# Patient Record
Sex: Male | Born: 2000 | Race: White | Hispanic: No | Marital: Single
Health system: Southern US, Community
[De-identification: ages and names within clinical notes are randomized; demographics above are authoritative.]

---

## 2021-04-15 ENCOUNTER — Other Ambulatory Visit: Payer: Self-pay

## 2021-04-15 ENCOUNTER — Emergency Department: Payer: 59

## 2021-04-15 ENCOUNTER — Encounter: Payer: Self-pay | Admitting: Emergency Medicine

## 2021-04-15 ENCOUNTER — Emergency Department
Admission: EM | Admit: 2021-04-15 | Discharge: 2021-04-15 | Disposition: A | Discharge status: Home / Self Care | Payer: 59 | Attending: Emergency Medicine | Admitting: Emergency Medicine

## 2021-04-15 DIAGNOSIS — R410 Disorientation, unspecified: Secondary | ICD-10-CM | POA: Diagnosis not present

## 2021-04-15 DIAGNOSIS — R109 Unspecified abdominal pain: Secondary | ICD-10-CM | POA: Diagnosis not present

## 2021-04-15 DIAGNOSIS — Y92410 Unspecified street and highway as the place of occurrence of the external cause: Secondary | ICD-10-CM | POA: Diagnosis not present

## 2021-04-15 DIAGNOSIS — S060X1A Concussion with loss of consciousness of 30 minutes or less, initial encounter: Secondary | ICD-10-CM | POA: Insufficient documentation

## 2021-04-15 DIAGNOSIS — T1490XA Injury, unspecified, initial encounter: Secondary | ICD-10-CM

## 2021-04-15 DIAGNOSIS — S060X9A Concussion with loss of consciousness of unspecified duration, initial encounter: Secondary | ICD-10-CM | POA: Diagnosis present

## 2021-04-15 LAB — COMPREHENSIVE METABOLIC PANEL
ALT: 29 U/L (ref 0–44)
AST: 28 U/L (ref 15–41)
Albumin: 4.8 g/dL (ref 3.5–5.0)
Alkaline Phosphatase: 66 U/L (ref 38–126)
Anion gap: 10 (ref 5–15)
BUN: 17 mg/dL (ref 6–20)
CO2: 24 mmol/L (ref 22–32)
Calcium: 9.5 mg/dL (ref 8.9–10.3)
Chloride: 103 mmol/L (ref 98–111)
Creatinine, Ser: 1.01 mg/dL (ref 0.61–1.24)
GFR, Estimated: >60 mL/min (ref >60)
Glucose, Bld: 159 mg/dL — ABNORMAL HIGH (ref 70–99)
Potassium: 3.5 mmol/L (ref 3.5–5.1)
Sodium: 137 mmol/L (ref 135–145)
Total Bilirubin: 1.3 mg/dL — ABNORMAL HIGH (ref 0.3–1.2)
Total Protein: 7.5 g/dL (ref 6.5–8.1)

## 2021-04-15 LAB — CBC WITH DIFFERENTIAL/PLATELET
Abs Immature Granulocytes: 0.16 10*3/uL — ABNORMAL HIGH (ref 0.00–0.07)
Basophils Absolute: 0.1 10*3/uL (ref 0.0–0.1)
Basophils Relative: 0 %
Eosinophils Absolute: 0 10*3/uL (ref 0.0–0.5)
Eosinophils Relative: 0 %
HCT: 42.6 % (ref 39.0–52.0)
Hemoglobin: 15.9 g/dL (ref 13.0–17.0)
Immature Granulocytes: 1 %
Lymphocytes Relative: 9 %
Lymphs Abs: 1.8 10*3/uL (ref 0.7–4.0)
MCH: 30 pg (ref 26.0–34.0)
MCHC: 37.3 g/dL — ABNORMAL HIGH (ref 30.0–36.0)
MCV: 80.4 fL (ref 80.0–100.0)
Monocytes Absolute: 1.3 10*3/uL — ABNORMAL HIGH (ref 0.1–1.0)
Monocytes Relative: 7 %
Neutro Abs: 16.9 10*3/uL — ABNORMAL HIGH (ref 1.7–7.7)
Neutrophils Relative %: 83 %
Platelets: 331 10*3/uL (ref 150–400)
RBC: 5.3 MIL/uL (ref 4.22–5.81)
RDW: 11.7 % (ref 11.5–15.5)
WBC: 20.2 10*3/uL — ABNORMAL HIGH (ref 4.0–10.5)
nRBC: 0 % (ref 0.0–0.2)
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
## 2021-04-15 MED ORDER — MORPHINE SULFATE (PF) 4 MG/ML IV SOLN
4.0000 mg | Freq: Once | INTRAVENOUS | Status: AC
  Administered 2021-04-15: 4 mg via INTRAVENOUS
  Filled 2021-04-15: qty 1

## 2021-04-15 MED ORDER — ONDANSETRON 4 MG PO TBDP: 4.0000 mg | ORAL_TABLET | Freq: Three times a day (TID) | ORAL | 0 refills | Status: AC | PRN

## 2021-04-15 MED ORDER — SODIUM CHLORIDE 0.9 % IV SOLN
12.5000 mg | Freq: Four times a day (QID) | INTRAVENOUS | Status: DC | PRN
  Administered 2021-04-15: 12.5 mg via INTRAVENOUS
  Filled 2021-04-15 (×2): qty 0.5
  Filled 2021-04-15: qty 12.5

## 2021-04-15 MED ORDER — IOHEXOL 300 MG/ML  SOLN
100.0000 mL | Freq: Once | INTRAMUSCULAR | Status: AC | PRN
  Administered 2021-04-15: 100 mL via INTRAVENOUS
  Filled 2021-04-15: qty 100

## 2021-04-15 MED ORDER — ONDANSETRON 4 MG PO TBDP
4.0000 mg | ORAL_TABLET | Freq: Once | ORAL | Status: AC
  Administered 2021-04-15: 4 mg via ORAL
  Filled 2021-04-15: qty 1
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
## 2021-04-15 NOTE — ED Notes (Signed)
Pt moved to room 6 via stretcher  Report called to [employer]
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
